# Patient Record
Sex: Female | Born: 1965 | Race: White | Hispanic: No | Marital: Married | Smoking: Never smoker
Health system: Southern US, Community
[De-identification: ages and names within clinical notes are randomized; demographics above are authoritative.]

## PROBLEM LIST (undated history)

## (undated) HISTORY — PX: CHOLECYSTECTOMY: SHX55

## (undated) HISTORY — PX: ABDOMINAL HYSTERECTOMY: SHX81

## (undated) HISTORY — PX: THYROIDECTOMY: SHX17

## (undated) HISTORY — PX: TONSILLECTOMY: SUR1361

## (undated) HISTORY — PX: BACK SURGERY: SHX140

---

## 1999-06-23 ENCOUNTER — Emergency Department (HOSPITAL_COMMUNITY): Admission: EM | Admit: 1999-06-23 | Discharge: 1999-06-24 | Payer: Self-pay | Admitting: Emergency Medicine

## 1999-06-24 ENCOUNTER — Encounter: Payer: Self-pay | Admitting: Internal Medicine

## 1999-12-02 ENCOUNTER — Encounter: Admission: RE | Admit: 1999-12-02 | Discharge: 2000-03-01 | Payer: Self-pay | Admitting: Family Medicine

## 2000-02-04 ENCOUNTER — Ambulatory Visit (HOSPITAL_BASED_OUTPATIENT_CLINIC_OR_DEPARTMENT_OTHER): Admission: RE | Admit: 2000-02-04 | Discharge: 2000-02-04 | Payer: Self-pay | Admitting: Orthopaedic Surgery

## 2001-12-23 ENCOUNTER — Emergency Department (HOSPITAL_COMMUNITY): Admission: EM | Admit: 2001-12-23 | Discharge: 2001-12-23 | Payer: Self-pay

## 2002-03-11 ENCOUNTER — Ambulatory Visit (HOSPITAL_COMMUNITY): Admission: RE | Admit: 2002-03-11 | Discharge: 2002-03-11 | Payer: Self-pay | Admitting: Family Medicine

## 2002-03-29 ENCOUNTER — Encounter: Payer: Self-pay | Admitting: Neurosurgery

## 2002-03-30 ENCOUNTER — Ambulatory Visit (HOSPITAL_COMMUNITY): Admission: RE | Admit: 2002-03-30 | Discharge: 2002-03-31 | Payer: Self-pay | Admitting: Neurosurgery

## 2002-03-30 ENCOUNTER — Encounter: Payer: Self-pay | Admitting: Neurosurgery

## 2002-08-04 ENCOUNTER — Encounter: Payer: Self-pay | Admitting: Neurosurgery

## 2002-08-04 ENCOUNTER — Encounter: Payer: Self-pay | Admitting: Diagnostic Radiology

## 2002-08-04 ENCOUNTER — Encounter: Admission: RE | Admit: 2002-08-04 | Discharge: 2002-08-04 | Payer: Self-pay | Admitting: Neurosurgery

## 2002-08-19 ENCOUNTER — Encounter: Payer: Self-pay | Admitting: Neurosurgery

## 2002-08-19 ENCOUNTER — Encounter: Admission: RE | Admit: 2002-08-19 | Discharge: 2002-08-19 | Payer: Self-pay | Admitting: Neurosurgery

## 2002-09-15 ENCOUNTER — Ambulatory Visit (HOSPITAL_COMMUNITY): Admission: RE | Admit: 2002-09-15 | Discharge: 2002-09-15 | Payer: Self-pay | Admitting: Neurosurgery

## 2002-09-15 ENCOUNTER — Encounter: Payer: Self-pay | Admitting: Neurosurgery

## 2012-07-03 DIAGNOSIS — E7800 Pure hypercholesterolemia, unspecified: Secondary | ICD-10-CM | POA: Insufficient documentation

## 2012-07-03 DIAGNOSIS — M961 Postlaminectomy syndrome, not elsewhere classified: Secondary | ICD-10-CM | POA: Insufficient documentation

## 2012-07-03 DIAGNOSIS — G4730 Sleep apnea, unspecified: Secondary | ICD-10-CM | POA: Insufficient documentation

## 2012-07-03 DIAGNOSIS — I10 Essential (primary) hypertension: Secondary | ICD-10-CM | POA: Insufficient documentation

## 2012-07-03 DIAGNOSIS — R32 Unspecified urinary incontinence: Secondary | ICD-10-CM | POA: Insufficient documentation

## 2012-07-03 DIAGNOSIS — K219 Gastro-esophageal reflux disease without esophagitis: Secondary | ICD-10-CM | POA: Insufficient documentation

## 2012-07-03 DIAGNOSIS — F309 Manic episode, unspecified: Secondary | ICD-10-CM | POA: Insufficient documentation

## 2015-04-17 ENCOUNTER — Other Ambulatory Visit: Payer: Self-pay | Admitting: Internal Medicine

## 2015-04-17 ENCOUNTER — Other Ambulatory Visit (HOSPITAL_BASED_OUTPATIENT_CLINIC_OR_DEPARTMENT_OTHER): Payer: Self-pay | Admitting: Physician Assistant

## 2015-04-17 DIAGNOSIS — R1084 Generalized abdominal pain: Secondary | ICD-10-CM

## 2015-04-17 DIAGNOSIS — Z1231 Encounter for screening mammogram for malignant neoplasm of breast: Secondary | ICD-10-CM

## 2015-04-24 ENCOUNTER — Other Ambulatory Visit (HOSPITAL_BASED_OUTPATIENT_CLINIC_OR_DEPARTMENT_OTHER): Payer: Self-pay | Admitting: Internal Medicine

## 2015-04-24 DIAGNOSIS — Z1231 Encounter for screening mammogram for malignant neoplasm of breast: Secondary | ICD-10-CM

## 2015-04-27 ENCOUNTER — Ambulatory Visit: Payer: BLUE CROSS/BLUE SHIELD

## 2015-05-03 ENCOUNTER — Ambulatory Visit (HOSPITAL_BASED_OUTPATIENT_CLINIC_OR_DEPARTMENT_OTHER)
Admission: RE | Admit: 2015-05-03 | Discharge: 2015-05-03 | Disposition: A | Discharge status: Home / Self Care | Payer: BLUE CROSS/BLUE SHIELD | Source: Ambulatory Visit | Attending: Internal Medicine | Admitting: Internal Medicine

## 2015-05-03 ENCOUNTER — Ambulatory Visit (HOSPITAL_BASED_OUTPATIENT_CLINIC_OR_DEPARTMENT_OTHER)
Admission: RE | Admit: 2015-05-03 | Discharge: 2015-05-03 | Disposition: A | Discharge status: Home / Self Care | Payer: BLUE CROSS/BLUE SHIELD | Source: Ambulatory Visit | Attending: Physician Assistant | Admitting: Physician Assistant

## 2015-05-03 DIAGNOSIS — R1084 Generalized abdominal pain: Secondary | ICD-10-CM

## 2015-05-03 DIAGNOSIS — Z1231 Encounter for screening mammogram for malignant neoplasm of breast: Secondary | ICD-10-CM

## 2015-08-06 DIAGNOSIS — E559 Vitamin D deficiency, unspecified: Secondary | ICD-10-CM | POA: Insufficient documentation

## 2015-08-06 DIAGNOSIS — G9332 Myalgic encephalomyelitis/chronic fatigue syndrome: Secondary | ICD-10-CM | POA: Insufficient documentation

## 2015-09-25 DIAGNOSIS — R748 Abnormal levels of other serum enzymes: Secondary | ICD-10-CM | POA: Insufficient documentation

## 2015-09-25 DIAGNOSIS — R739 Hyperglycemia, unspecified: Secondary | ICD-10-CM | POA: Insufficient documentation

## 2017-01-25 IMAGING — US US ABDOMEN COMPLETE
1 series · 13 of 25 positions shown · non-contrast
Comparison: CT abdomen and pelvis December 04, 2009

CLINICAL DATA: Palpable fullness right upper quadrant, chronic.

EXAM:
ABDOMEN ULTRASOUND COMPLETE

[Series 1: us abdomen complete · 0.37mm/px · 13 of 72 slices shown]
[im 1/72]
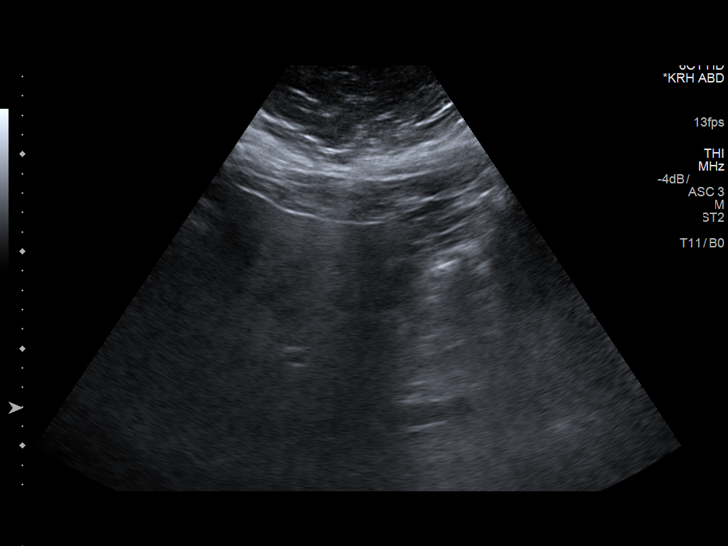
[im 6/72]
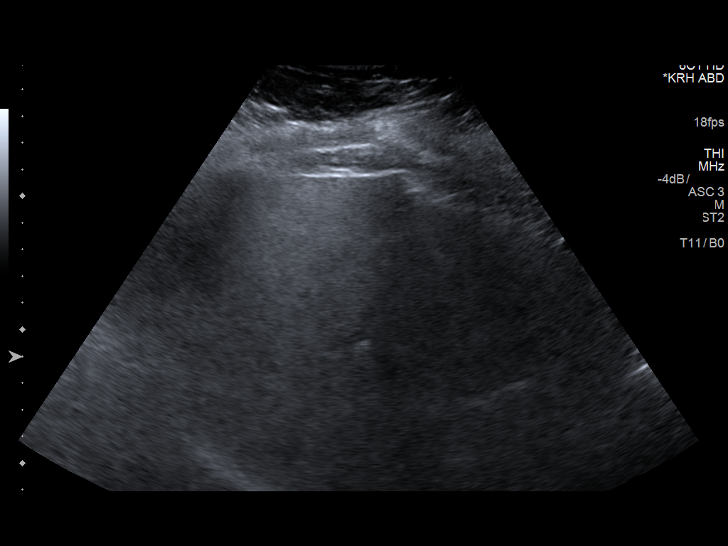
[im 12/72]
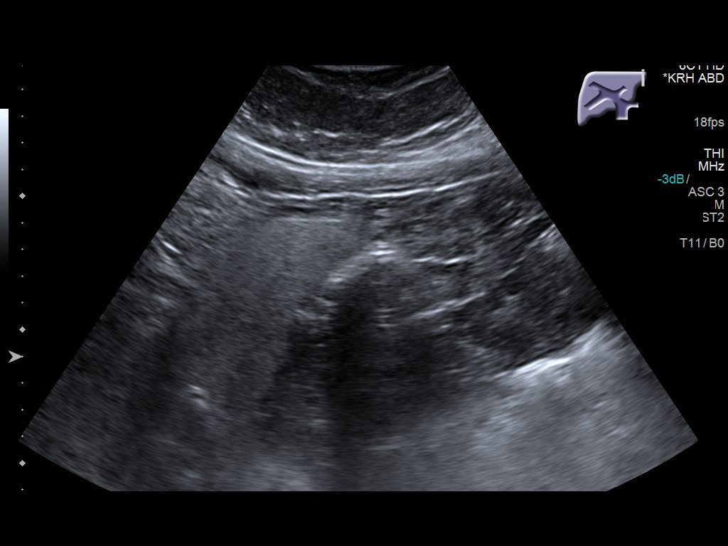
[im 18/72]
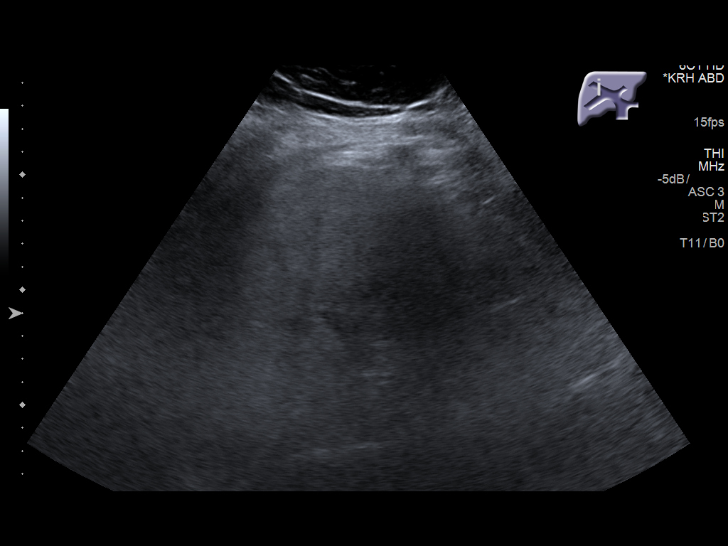
[im 24/72]
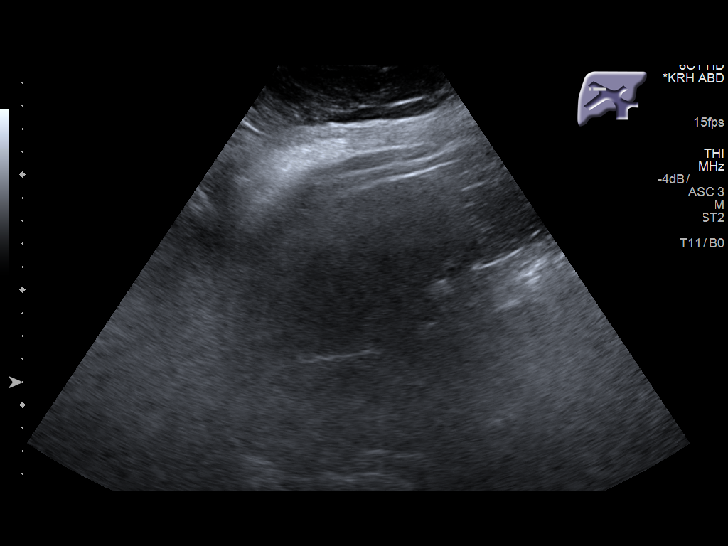
[im 30/72]
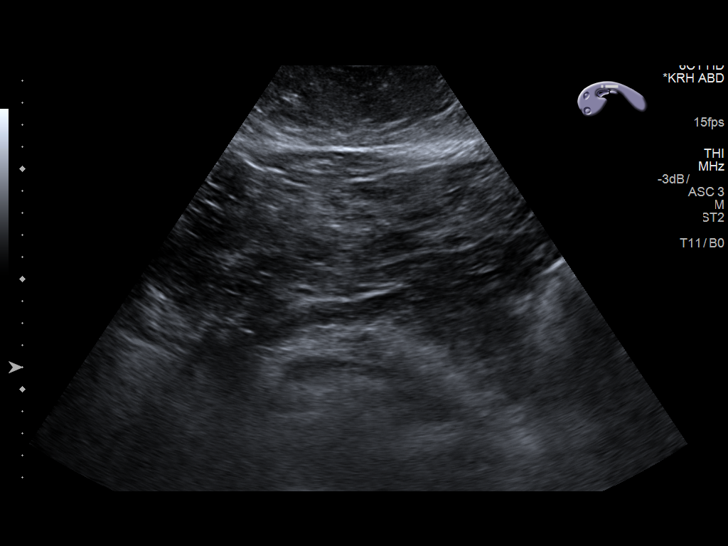
[im 36/72]
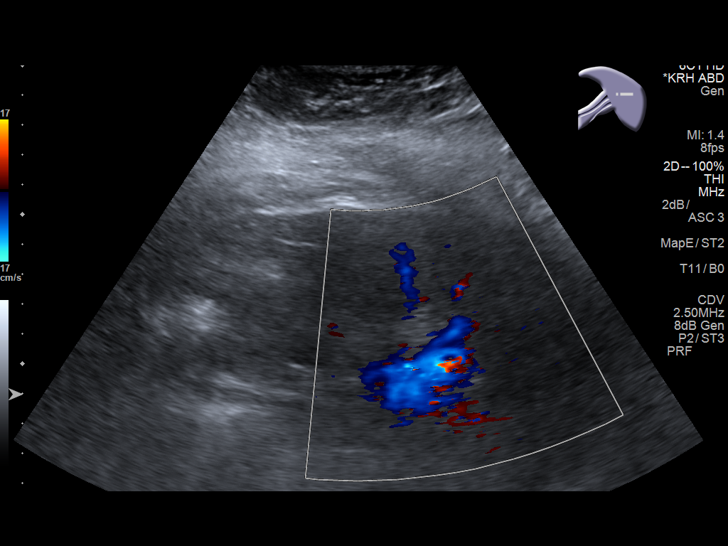
[im 42/72]
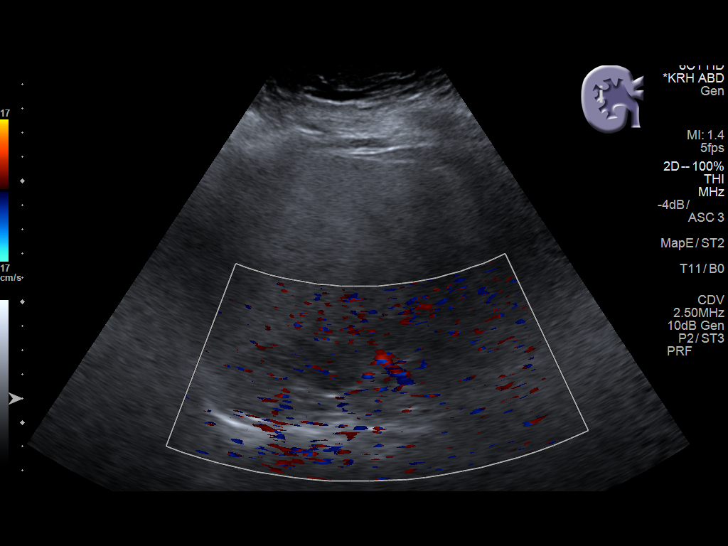
[im 48/72]
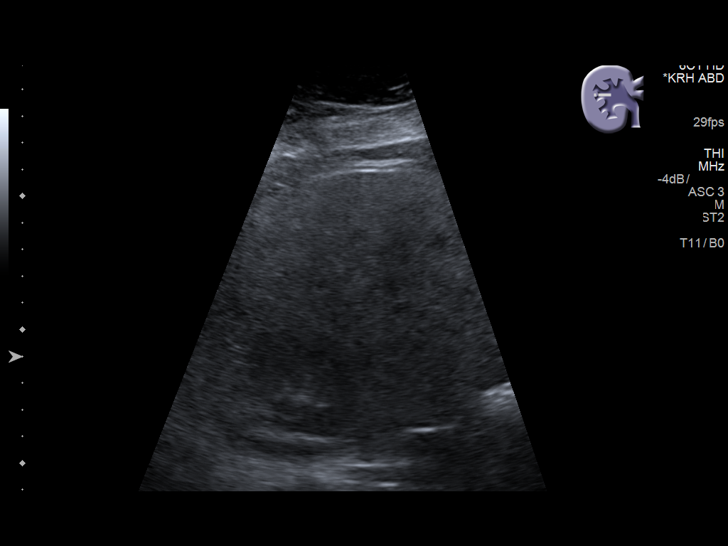
[im 54/72]
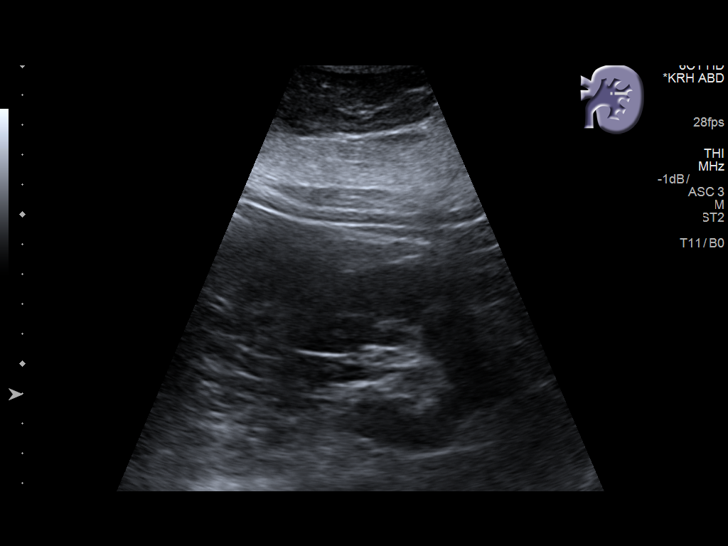
[im 60/72]
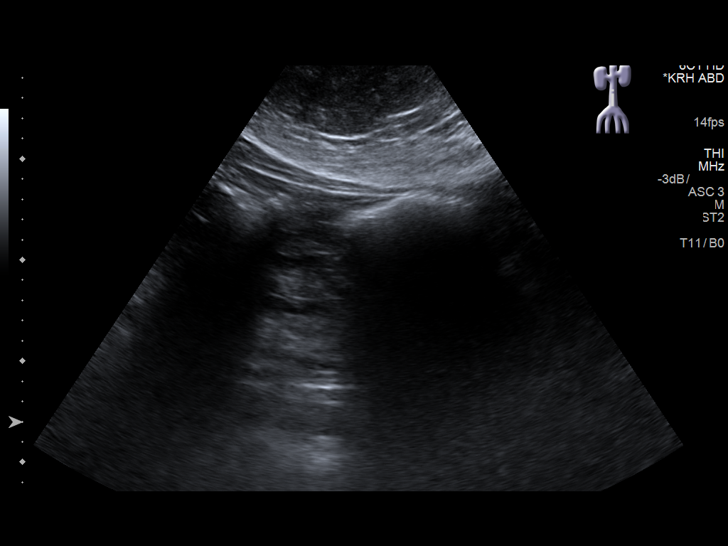
[im 66/72]
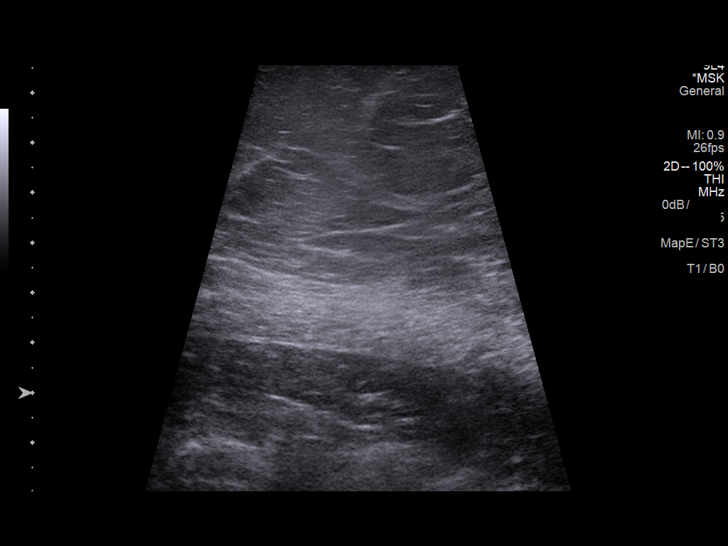
[im 72/72]
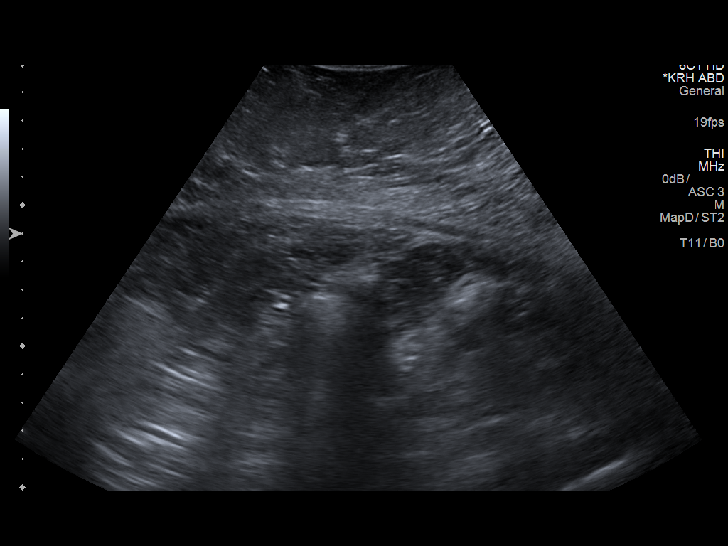

[13 of 25 positions shown; findings below may reference images not displayed]

FINDINGS: Gallbladder: Surgically absent.

Common bile duct: Diameter: 4 mm. There is no intrahepatic, common
hepatic, or common bile duct dilatation.

Liver: No focal lesion identified. The liver echogenicity is
somewhat inhomogeneous and diffusely increased.

IVC: No abnormality visualized.

Pancreas: Visualized portion unremarkable. Portions of pancreas
obscured by gas.

Spleen: Size and appearance within normal limits.

Right Kidney: Length: 11.8 cm. Echogenicity within normal limits. No
mass or hydronephrosis visualized.

Left Kidney: Length: 11.7 cm. Echogenicity within normal limits. No
mass or hydronephrosis visualized.

Abdominal aorta: No aneurysm visualized.

Other findings: There is no ascites. The patient reports fullness in
the right abdomen medial to the liver and slightly inferior to the
pancreas. Ultrasound of this area shows no demonstrable mass or
hernia. There is peristalsing bowel throughout this region.
IMPRESSION: No mass or hernia is demonstrated by ultrasound. If there remains
concern for potential lesion in the area of palpable fullness,
abdominal CT, ideally with oral and intravenous contrast, could be
helpful for further assessment.

Gallbladder absent.

Portions of the pancreas are obscured by gas. Visualized portions of
pancreas appear normal.

Liver echogenicity is increased and somewhat inhomogeneous. This
appearance is most indicative of hepatic steatosis. While no focal
liver lesions are identified, it must be cautioned that sensitivity
of ultrasound for focal liver lesions is diminished in this
circumstance.

## 2017-04-03 ENCOUNTER — Encounter (HOSPITAL_BASED_OUTPATIENT_CLINIC_OR_DEPARTMENT_OTHER): Payer: Self-pay | Admitting: *Deleted

## 2017-04-03 ENCOUNTER — Emergency Department (HOSPITAL_BASED_OUTPATIENT_CLINIC_OR_DEPARTMENT_OTHER): Payer: Managed Care, Other (non HMO)

## 2017-04-03 ENCOUNTER — Other Ambulatory Visit: Payer: Self-pay

## 2017-04-03 ENCOUNTER — Emergency Department (HOSPITAL_BASED_OUTPATIENT_CLINIC_OR_DEPARTMENT_OTHER)
Admission: EM | Admit: 2017-04-03 | Discharge: 2017-04-03 | Disposition: A | Discharge status: Home / Self Care | Payer: Managed Care, Other (non HMO) | Attending: Emergency Medicine | Admitting: Emergency Medicine

## 2017-04-03 DIAGNOSIS — J181 Lobar pneumonia, unspecified organism: Secondary | ICD-10-CM | POA: Insufficient documentation

## 2017-04-03 DIAGNOSIS — R05 Cough: Secondary | ICD-10-CM | POA: Diagnosis present

## 2017-04-03 DIAGNOSIS — J189 Pneumonia, unspecified organism: Secondary | ICD-10-CM

## 2017-04-03 DIAGNOSIS — R062 Wheezing: Secondary | ICD-10-CM | POA: Diagnosis not present

## 2017-04-03 MED ORDER — ALBUTEROL SULFATE HFA 108 (90 BASE) MCG/ACT IN AERS
2.0000 | INHALATION_SPRAY | RESPIRATORY_TRACT | Status: DC | PRN
  Administered 2017-04-03: 2 via RESPIRATORY_TRACT
  Filled 2017-04-03: qty 6.7

## 2017-04-03 MED ORDER — ALBUTEROL SULFATE (2.5 MG/3ML) 0.083% IN NEBU
2.5000 mg | INHALATION_SOLUTION | Freq: Once | RESPIRATORY_TRACT | Status: AC
  Administered 2017-04-03: 2.5 mg via RESPIRATORY_TRACT
  Filled 2017-04-03: qty 3

## 2017-04-03 MED ORDER — PROMETHAZINE-DM 6.25-15 MG/5ML PO SYRP: 5.0000 mL | ORAL_SOLUTION | Freq: Four times a day (QID) | ORAL | 0 refills | Status: AC | PRN

## 2017-04-03 MED ORDER — IPRATROPIUM-ALBUTEROL 0.5-2.5 (3) MG/3ML IN SOLN
3.0000 mL | Freq: Once | RESPIRATORY_TRACT | Status: AC
  Administered 2017-04-03: 3 mL via RESPIRATORY_TRACT
  Filled 2017-04-03: qty 3

## 2017-04-03 MED ORDER — AZITHROMYCIN 250 MG PO TABS: 250.0000 mg | ORAL_TABLET | Freq: Every day | ORAL | 0 refills | Status: AC

## 2017-04-03 NOTE — ED Triage Notes (Signed)
States she had a cold a couple of weeks ago. Her symptoms have gotten worse. Her ribs hurt from coughing so much. States she feels sob. She stopped seeing a MD 2 years ago. She is anxious at triage. Speaking fast and in complete sentences.

## 2017-04-03 NOTE — ED Notes (Signed)
RT at Bedside 

## 2017-04-03 NOTE — Discharge Instructions (Signed)
As discussed, take entire course of antibiotics even if you feel better.  Cough medication as needed.  Use your inhaler as instructed and as needed for wheezing.  Stay well hydrated, rest and follow-up with your primary care provider.  Return if symptoms worsen or new concerning symptoms in the meantime.

## 2017-04-03 NOTE — ED Notes (Signed)
Pt verbalized understanding of discharge instructions and denies any further questions at this time.   
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
## 2019-09-19 ENCOUNTER — Other Ambulatory Visit: Payer: Self-pay | Admitting: Physician Assistant

## 2019-09-19 DIAGNOSIS — R52 Pain, unspecified: Secondary | ICD-10-CM

## 2019-09-26 ENCOUNTER — Other Ambulatory Visit: Payer: Self-pay

## 2019-09-26 ENCOUNTER — Ambulatory Visit: Payer: BC Managed Care – PPO

## 2019-09-26 DIAGNOSIS — R52 Pain, unspecified: Secondary | ICD-10-CM

## 2020-04-05 DIAGNOSIS — R7303 Prediabetes: Secondary | ICD-10-CM | POA: Insufficient documentation

## 2021-04-03 DIAGNOSIS — K760 Fatty (change of) liver, not elsewhere classified: Secondary | ICD-10-CM | POA: Insufficient documentation

## 2021-09-29 IMAGING — US US EXTREM LOW VENOUS*R*
1 series · 13 of 24 positions shown · non-contrast
Comparison: None.

CLINICAL DATA: Right lower extremity pain and edema.



[Series 1: us extrem low venous*right* · 0.09mm/px · 13 of 41 slices shown]
[im 1/41]
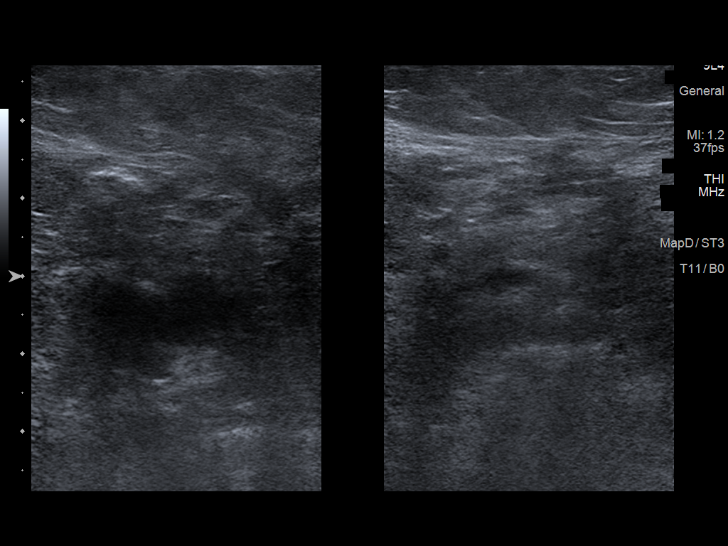
[im 4/41]
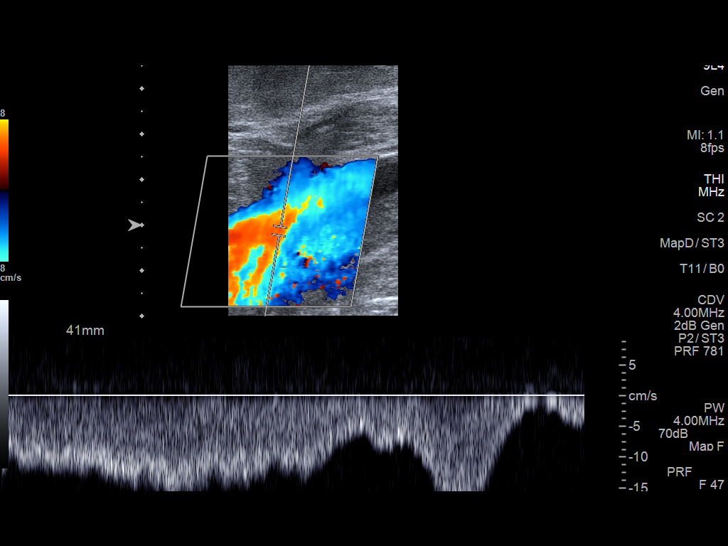
[im 7/41]
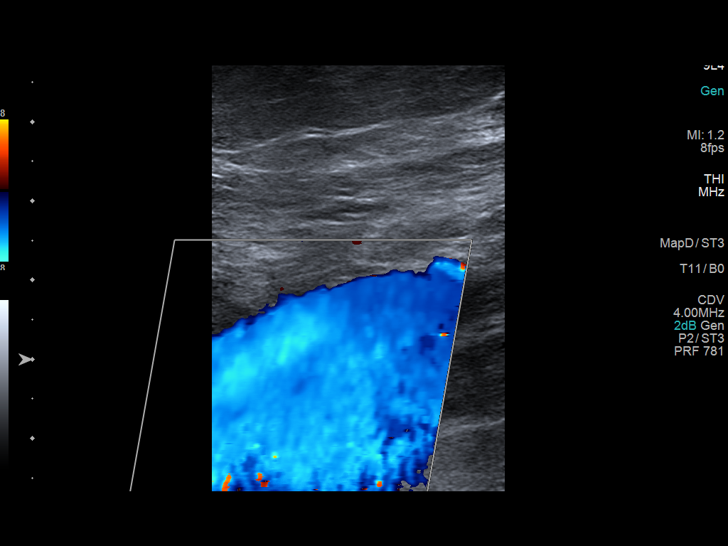
[im 11/41]
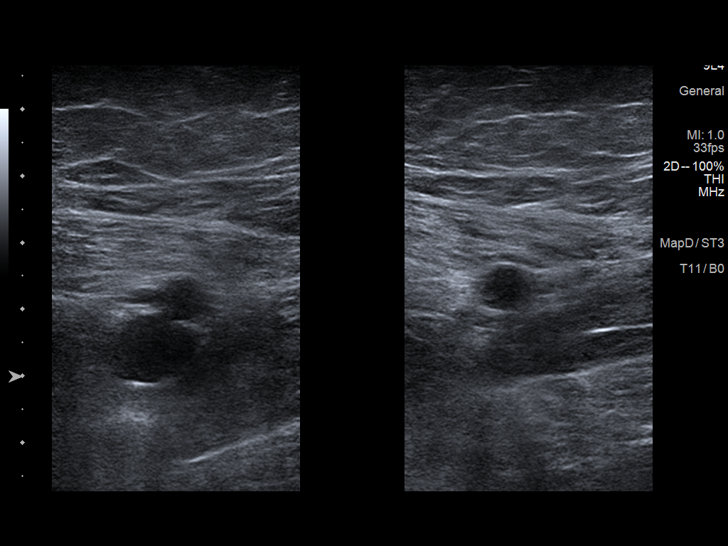
[im 14/41]
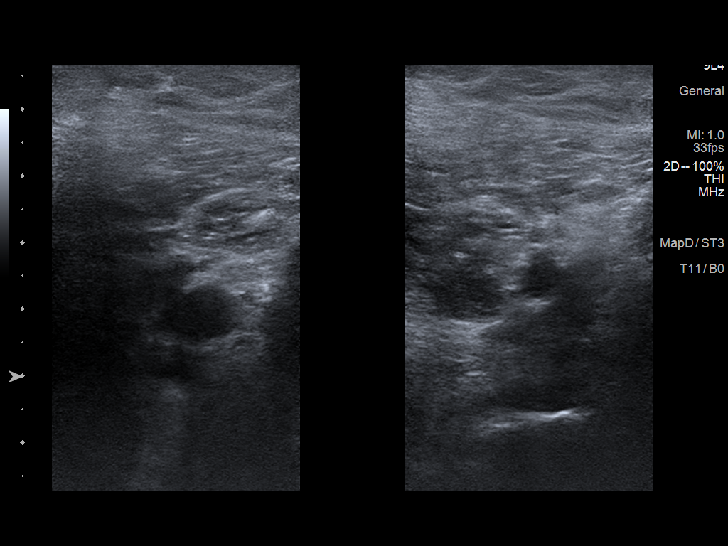
[im 18/41]
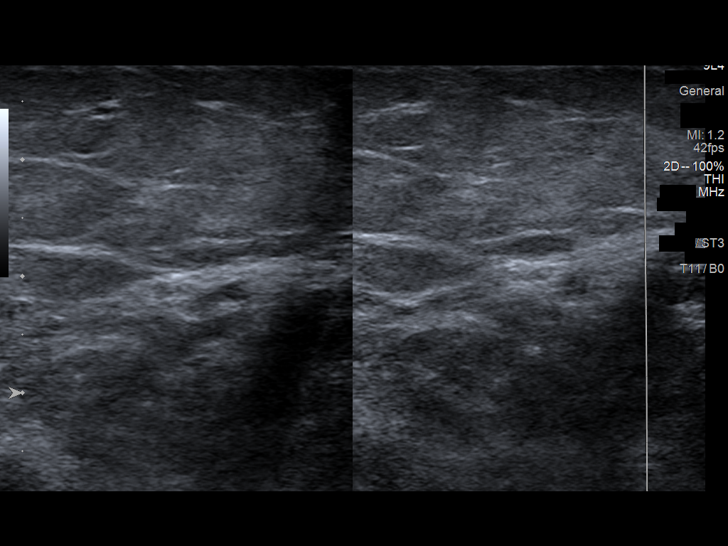
[im 21/41]
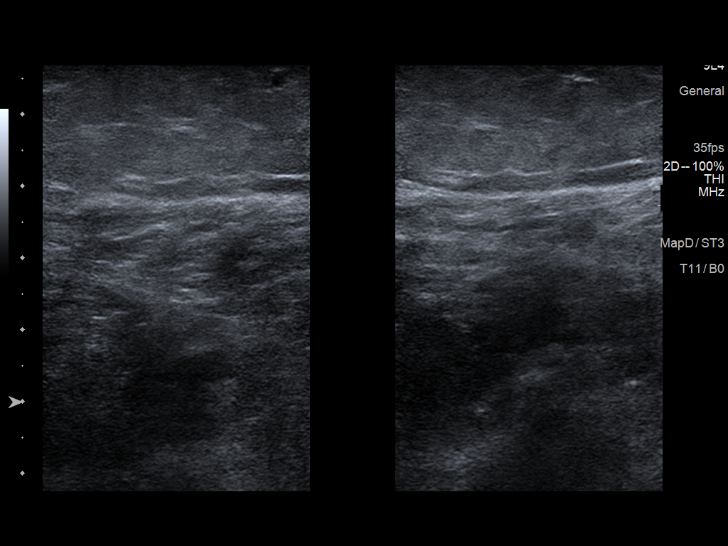
[im 23/41]
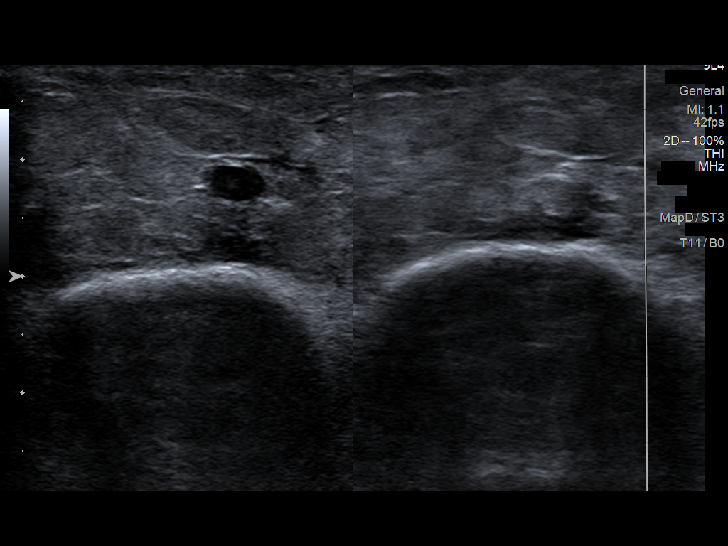
[im 27/41]
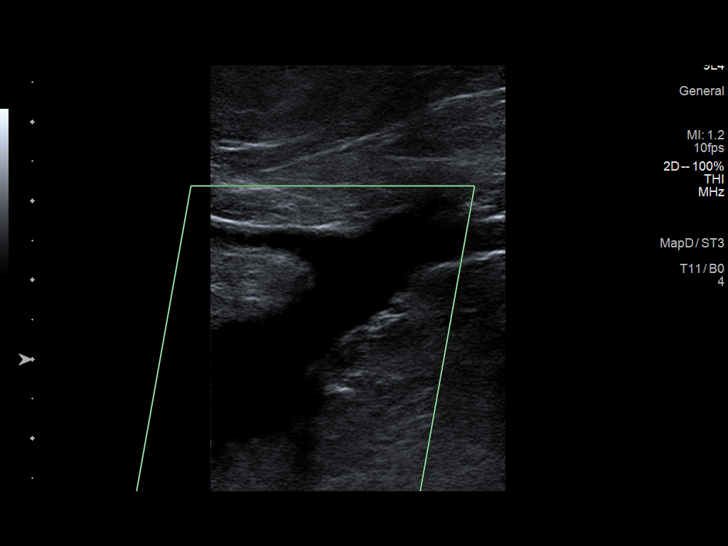
[im 30/41]
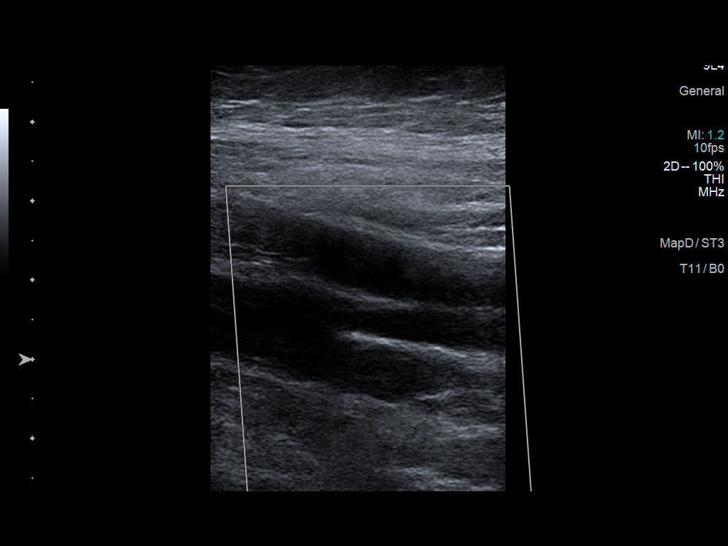
[im 34/41]
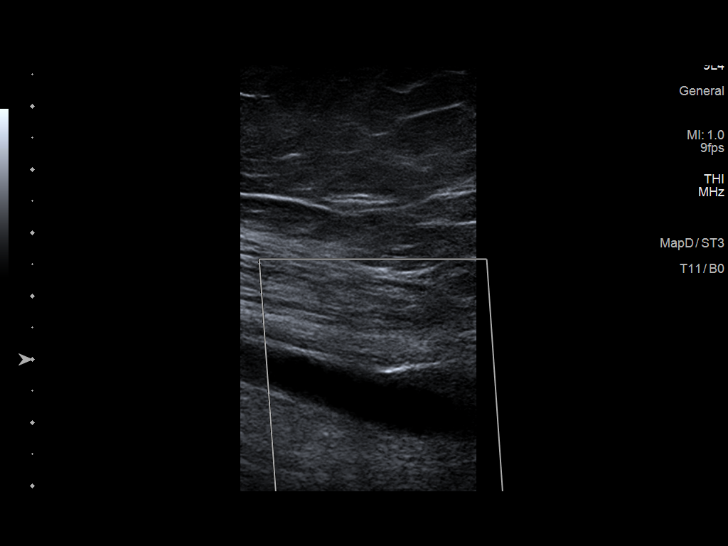
[im 37/41]
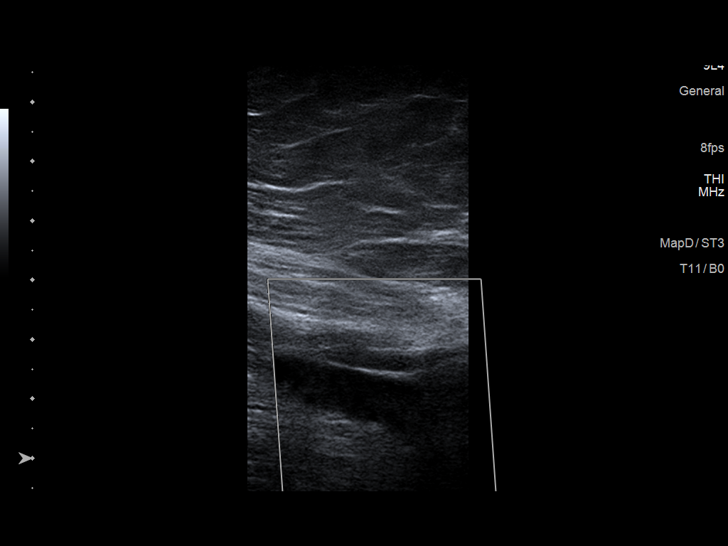
[im 41/41]
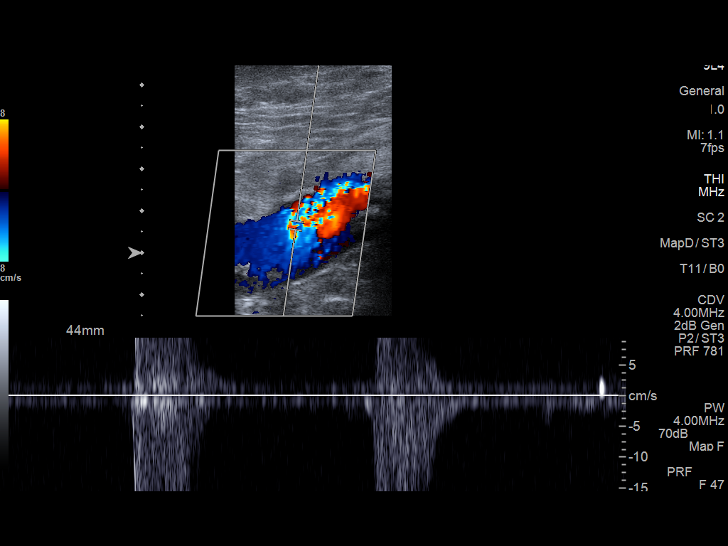

[13 of 24 positions shown; findings below may reference images not displayed]

FINDINGS: Contralateral Common Femoral Vein: Respiratory phasicity is normal
and symmetric with the symptomatic side. No evidence of thrombus.
Normal compressibility.

Common Femoral Vein: No evidence of thrombus. Normal
compressibility, respiratory phasicity and response to augmentation.

Saphenofemoral Junction: No evidence of thrombus. Normal
compressibility and flow on color Doppler imaging.

Profunda Femoral Vein: No evidence of thrombus. Normal
compressibility and flow on color Doppler imaging.

Femoral Vein: No evidence of thrombus. Normal compressibility,
respiratory phasicity and response to augmentation.

Popliteal Vein: No evidence of thrombus. Normal compressibility,
respiratory phasicity and response to augmentation.

Calf Veins: No evidence of thrombus. Normal compressibility and flow
on color Doppler imaging.

Superficial Great Saphenous Vein: No evidence of thrombus. Normal
compressibility.

Venous Reflux:  None.

Other Findings: No evidence of superficial thrombophlebitis or
abnormal fluid collection.
IMPRESSION: No evidence of right lower extremity deep venous thrombosis.

## 2022-12-01 ENCOUNTER — Ambulatory Visit: Payer: BC Managed Care – PPO | Admitting: Bariatrics

## 2023-02-02 ENCOUNTER — Encounter: Payer: BC Managed Care – PPO | Admitting: Bariatrics
